# Patient Record
Sex: Female | Born: 2000 | Race: Black or African American | Hispanic: No | Marital: Single | Smoking: Never smoker
Health system: Southern US, Community
[De-identification: ages and names within clinical notes are randomized; demographics above are authoritative.]

## PROBLEM LIST (undated history)

## (undated) ENCOUNTER — Inpatient Hospital Stay (HOSPITAL_COMMUNITY): Payer: Self-pay

---
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
## 2020-05-02 IMAGING — US US OB < 14 WEEKS - US OB TV
1 series · 15 of 28 positions shown · non-contrast
Comparison: None.

CLINICAL DATA: 16-year-old pregnant female presents with vaginal
bleeding. Quantitative beta HCG 92.

EDC by LMP: 07/24/2018, projecting to an expected gestational age of
6 weeks 6 days.
EXAM:
OBSTETRIC <14 WK US AND TRANSVAGINAL OB US
TECHNIQUE: Both transabdominal and transvaginal ultrasound examinations were
performed for complete evaluation of the gestation as well as the
maternal uterus, adnexal regions, and pelvic cul-de-sac.
Transvaginal technique was performed to assess early pregnancy.

[Series 1: us ob < 14 weeks - us ob tv · 57 acquisitions, 15 frames shown]
[im 1/57]
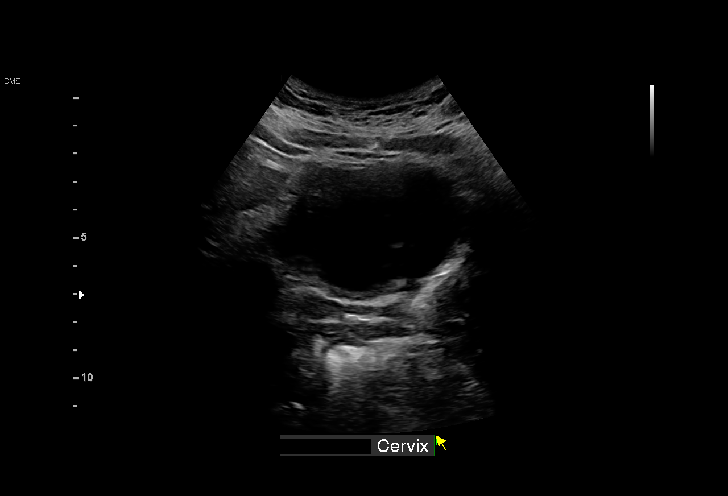
[im 5/57]
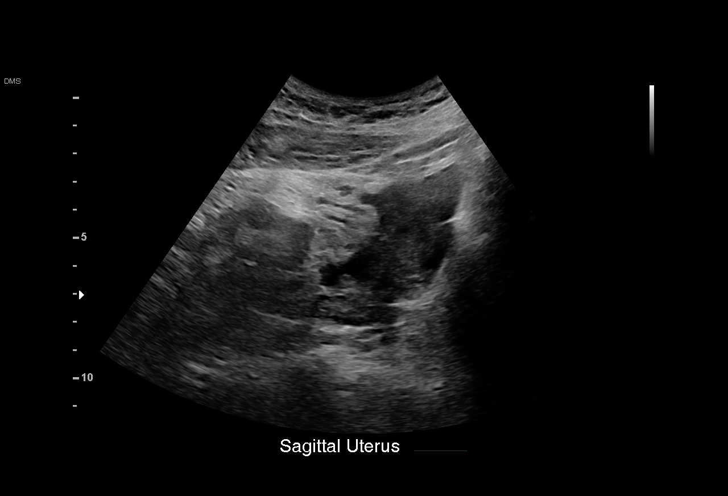
[im 9/57]
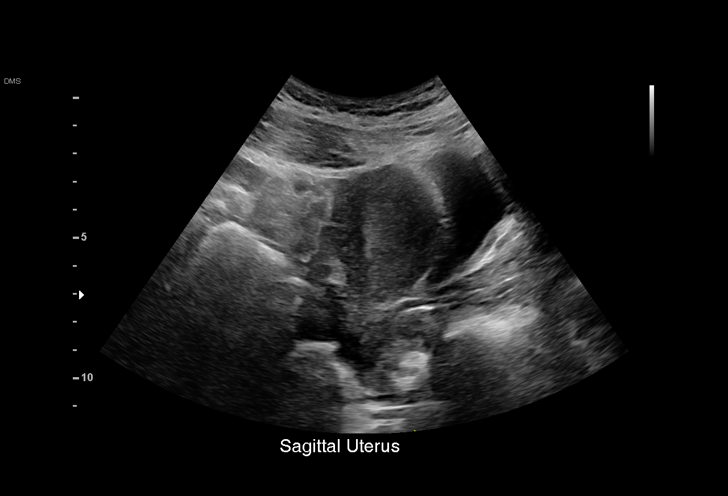
[im 13/57]
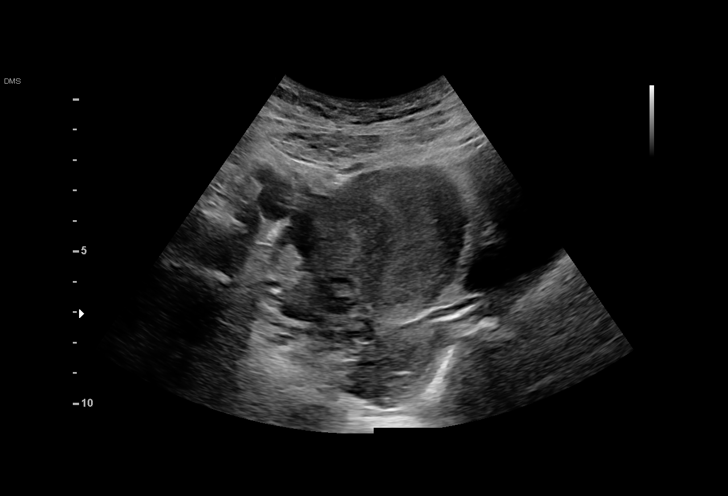
[im 17/57]
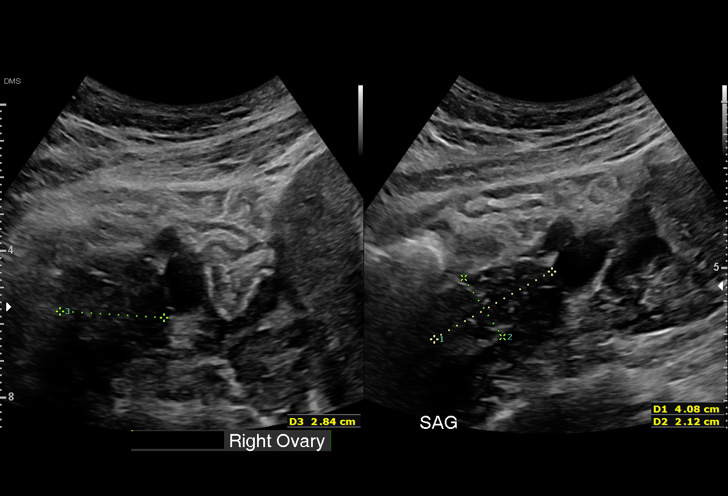
[im 21/57]
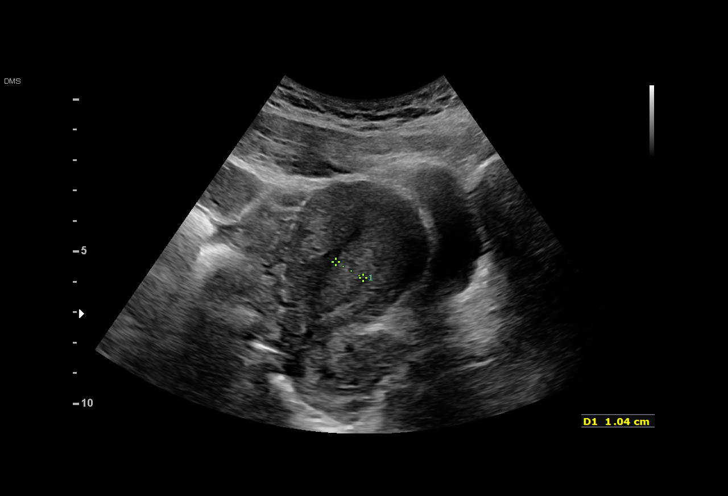
[im 25/57]
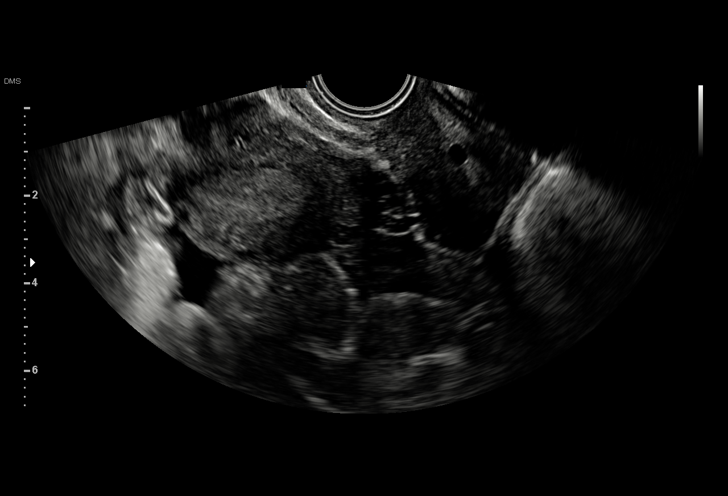
[im 30/57]
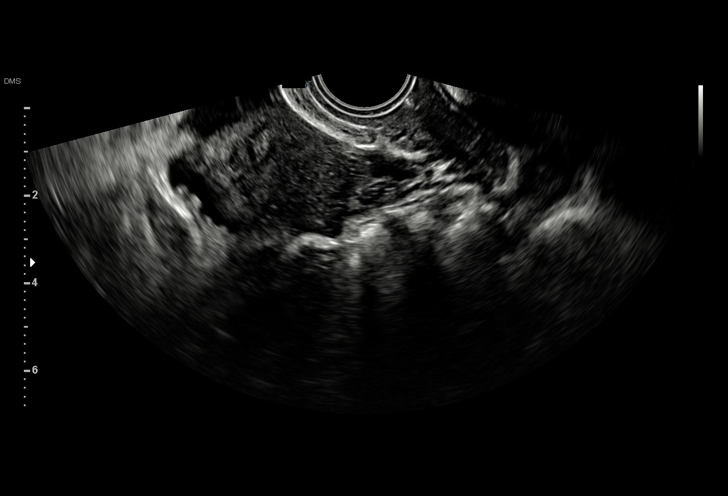
[im 32/57]
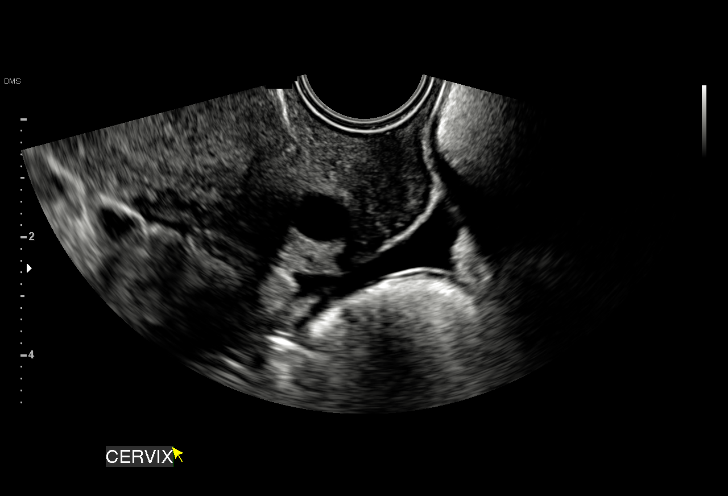
[im 36/57]
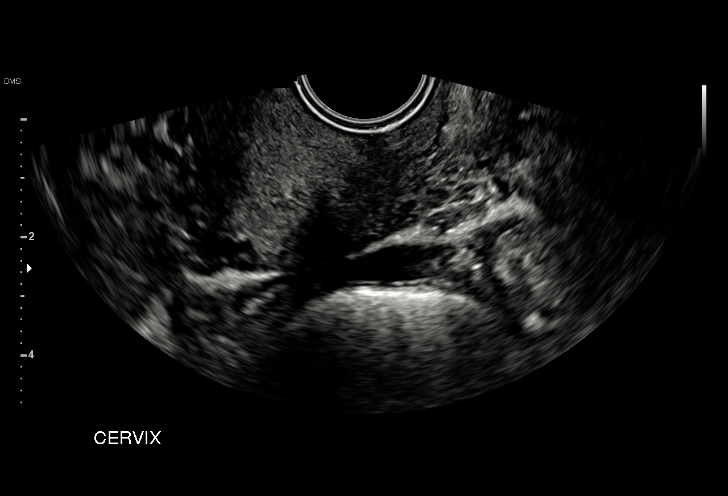
[im 40/57]
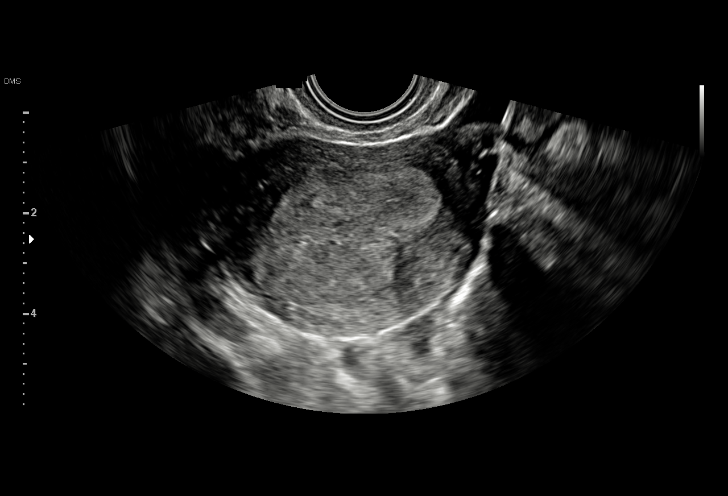
[im 44/57]
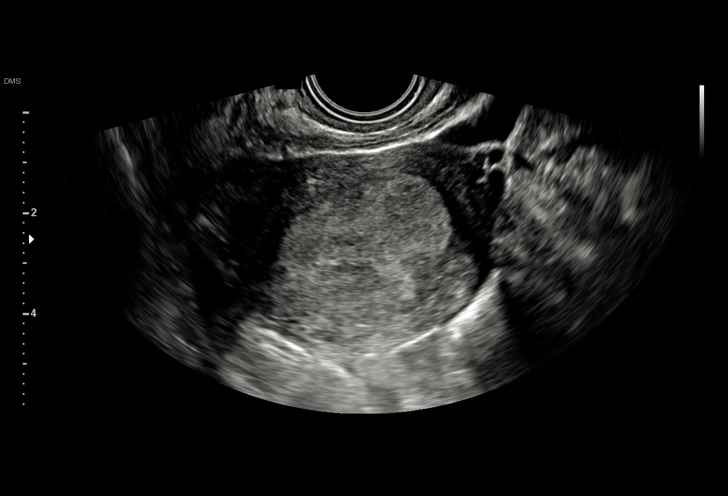
[im 48/57]
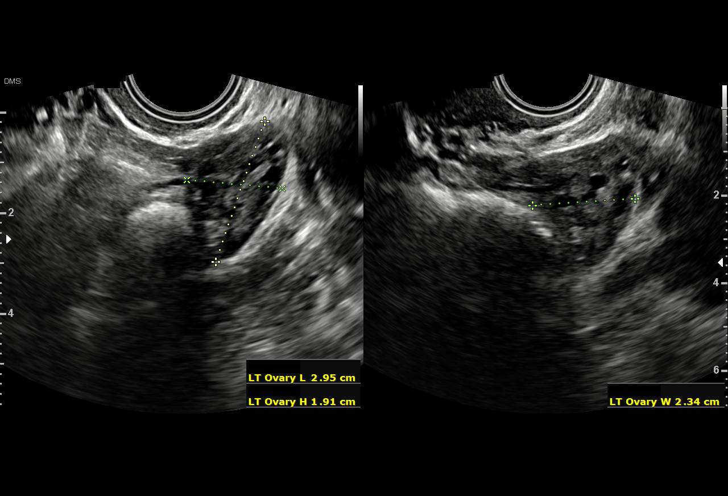
[im 52/57]
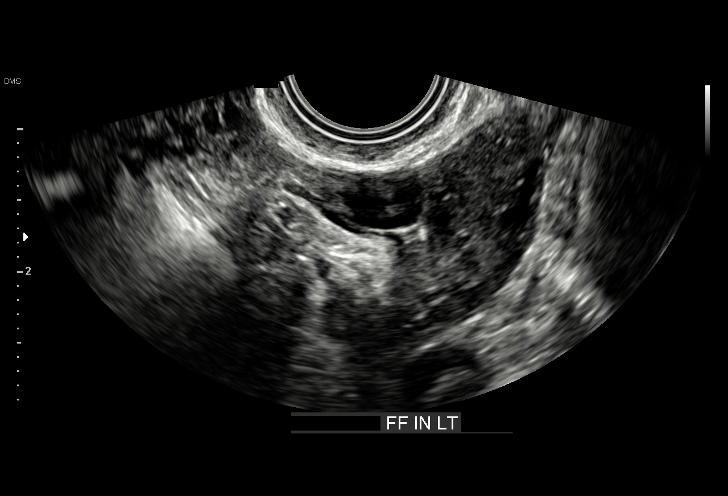
[im 57/57]
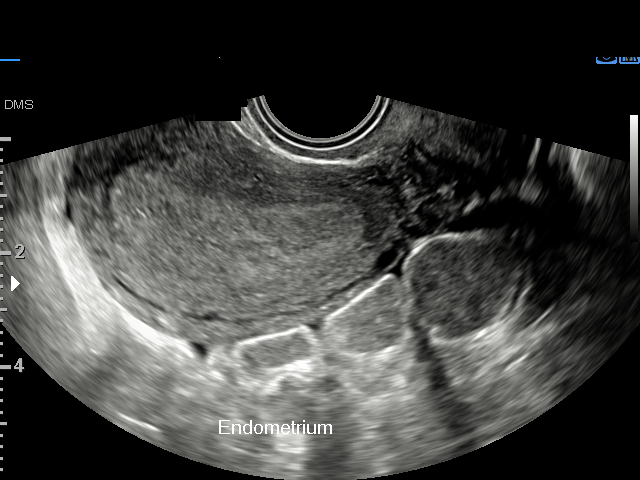

[15 of 28 positions shown; findings below may reference images not displayed]

FINDINGS: Anteverted uterus is normal in size and configuration. No uterine
fibroids or other myometrial abnormalities. Bilayer endometrial
thickness 19 mm. No intrauterine gestational sac. No endometrial
cavity fluid or focal endometrial mass.

Right ovary measures 3.3 x 2.6 x 3.1 cm. Left ovary measures 3.0 x
1.9 x 2.3 cm. There is a 1.4 x 0.8 cm thin walled left adnexal
paraovarian/paratubal cyst adjacent to the left ovary. No additional
ovarian or adnexal masses. Small volume simple free fluid in the
pelvic cul-de-sac.
IMPRESSION: Non-localization of the pregnancy on this scan. No intrauterine
gestational sac. No suspicious ovarian or adnexal masses. Simple
thin-walled 1.4 cm left adnexal paraovarian/paratubal cyst adjacent
to the left ovary. Small volume simple free fluid in the pelvic
cul-de-sac. Sonographic differential diagnosis includes intrauterine
gestation too early to visualize, spontaneous abortion or occult
ectopic gestation. Recommend close clinical follow-up and serial
serum beta HCG monitoring, with repeat obstetric scan as warranted
by beta HCG levels and clinical assessment.
